# Patient Record
Sex: Male | Born: 1990 | Race: Black or African American | Hispanic: No | Marital: Single | Smoking: Never smoker
Health system: Southern US, Community
[De-identification: ages and names within clinical notes are randomized; demographics above are authoritative.]

---
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
## 2014-04-01 ENCOUNTER — Emergency Department (INDEPENDENT_AMBULATORY_CARE_PROVIDER_SITE_OTHER)
Admission: EM | Admit: 2014-04-01 | Discharge: 2014-04-01 | Disposition: A | Discharge status: Home / Self Care | Payer: 59 | Source: Home / Self Care | Attending: Family Medicine | Admitting: Family Medicine

## 2014-04-01 ENCOUNTER — Other Ambulatory Visit (HOSPITAL_COMMUNITY)
Admission: RE | Admit: 2014-04-01 | Discharge: 2014-04-01 | Disposition: A | Discharge status: Home / Self Care | Payer: 59 | Source: Ambulatory Visit | Attending: Family Medicine | Admitting: Family Medicine

## 2014-04-01 ENCOUNTER — Encounter (HOSPITAL_COMMUNITY): Payer: Self-pay | Admitting: Emergency Medicine

## 2014-04-01 DIAGNOSIS — N485 Ulcer of penis: Secondary | ICD-10-CM

## 2014-04-01 DIAGNOSIS — Z113 Encounter for screening for infections with a predominantly sexual mode of transmission: Secondary | ICD-10-CM | POA: Insufficient documentation

## 2014-04-01 DIAGNOSIS — N4889 Other specified disorders of penis: Secondary | ICD-10-CM

## 2014-04-01 LAB — HIV ANTIBODY (ROUTINE TESTING W REFLEX): HIV 1&2 Ab, 4th Generation: NONREACTIVE

## 2014-04-01 LAB — RPR

## 2014-04-01 MED ORDER — CEFTRIAXONE SODIUM 250 MG IJ SOLR
INTRAMUSCULAR | Status: AC
  Filled 2014-04-01: qty 250

## 2014-04-01 MED ORDER — AZITHROMYCIN 250 MG PO TABS
1000.0000 mg | ORAL_TABLET | Freq: Once | ORAL | Status: AC
  Administered 2014-04-01: 1000 mg via ORAL

## 2014-04-01 MED ORDER — PENICILLIN G BENZATHINE 1200000 UNIT/2ML IM SUSP
2.4000 10*6.[IU] | Freq: Once | INTRAMUSCULAR | Status: AC
  Administered 2014-04-01: 2.4 10*6.[IU] via INTRAMUSCULAR

## 2014-04-01 MED ORDER — AZITHROMYCIN 250 MG PO TABS
ORAL_TABLET | ORAL | Status: AC
  Filled 2014-04-01: qty 4

## 2014-04-01 MED ORDER — PENICILLIN G BENZATHINE 1200000 UNIT/2ML IM SUSP
INTRAMUSCULAR | Status: AC
  Filled 2014-04-01: qty 4

## 2014-04-01 MED ORDER — CEFTRIAXONE SODIUM 250 MG IJ SOLR
250.0000 mg | Freq: Once | INTRAMUSCULAR | Status: AC
  Administered 2014-04-01: 250 mg via INTRAMUSCULAR

## 2014-04-01 NOTE — Discharge Instructions (Signed)
We have tested you for multiple sexually transmitted infections as this lesion on your penis is suspicious for herpes or syphilis. You will be notified by our nurse here at the clinic should your lab results indicate the need for additional treatment. Please keep area clean and dry and do not engage in sexual activity until test results are available. You have also been treated today for gonorrhea, syphilis and chlamydia. If condition does not begin to improve over the next few days, please return for re-evaluation.

## 2014-04-01 NOTE — ED Provider Notes (Signed)
CSN: [id number]     Arrival date & time 04/01/14  1145 History   First MD Initiated Contact with Patient 04/01/14 1300     Chief Complaint  Patient presents with  . Abrasion   (Consider location/radiation/quality/duration/timing/severity/associated sxs/prior Treatment) HPI Comments: Patient presents with 12-14 day history of a tender genital lesion. States he thought the lesions may have been the result of running into another player during a basketball game two weeks ago. Patient declines to discuss sexual history. 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 PCP: none Denies fever/chill, dysuria, hematuria, or penile discharge Has had no previous episodes of same.   The history is provided by the patient.    History reviewed. No pertinent past medical history. History reviewed. No pertinent past surgical history. History reviewed. No pertinent family history. History  Substance Use Topics  . Smoking status: Never Smoker   . Smokeless tobacco: Not on file  . Alcohol Use: No    Review of Systems  All other systems reviewed and are negative.   Allergies  Review of patient's allergies indicates no known allergies.  Home Medications   Prior to Admission medications   Medication Sig Start Date End Date Taking? Authorizing Provider  neomycin-bacitracin-polymyxin (NEOSPORIN) ointment Apply 1 application topically every 12 (twelve) hours. apply to eye   Yes Historical Provider, MD   BP 131/70  Pulse 77  Temp(Src) 99.5 F (37.5 C) (Oral)  Resp 14  SpO2 100% Physical Exam  Nursing note and vitals reviewed. Constitutional: He is oriented to person, place, and time. He appears well-developed and well-nourished. No distress.  HENT:  Head: Normocephalic and atraumatic.  Mouth/Throat: Oropharynx is clear and moist.  No mucous membrane lesions  Eyes: Conjunctivae are normal.  Cardiovascular: Normal rate, regular rhythm and normal heart sounds.   Pulmonary/Chest: Effort normal  and breath sounds normal.  Genitourinary: Testes normal.    Right testis shows no mass, no swelling and no tenderness. Left testis shows no mass, no swelling and no tenderness. Circumcised.  Musculoskeletal: Normal range of motion.  Lymphadenopathy:       Right: No inguinal adenopathy present.       Left: No inguinal adenopathy present.  Neurological: He is alert and oriented to person, place, and time.  Skin: Skin is warm and dry.  See GU exam  Psychiatric: He has a normal mood and affect. His behavior is normal.    ED Course  Procedures (including critical care time) Labs Review Labs Reviewed  HERPES SIMPLEX VIRUS CULTURE  RPR  HIV ANTIBODY (ROUTINE TESTING)  HSV 1 ANTIBODY, IGG  HSV 2 ANTIBODY, IGG  URINE CYTOLOGY ANCILLARY ONLY    Imaging Review No results found.   MDM   1. Penile ulcer    Genital ulcer: testing sent for syphilis, gonorrhea, chlamydia, HSV1, HSV2, and HIV. Treated empirically for gonorrhea, chlamydia and primary syphilis. Will contact patient if labs indicate need for additional treatment.    Jess BartersJennifer Lee Thunder MountainPresson, [place] 04/01/14 [phone]

## 2014-04-01 NOTE — ED Notes (Signed)
Pt  Reports  He  Sustained  An inj  To   His  Penis  About 1  Week  Ago    Which  He  Reports  He  Was  Struck  By  Another  Players  Knee     He  denys  Any  Difficulty  Urinating  He   Reports  It  Was  Swollen  More   Early  And  It is  No  Better  He  Reports  He  Has  Been  appliing  Topical neosporin to  The  area

## 2014-04-02 LAB — URINE CYTOLOGY ANCILLARY ONLY
Chlamydia: POSITIVE — AB
NEISSERIA GONORRHEA: NEGATIVE
TRICH (WINDOWPATH): POSITIVE — AB

## 2014-04-02 LAB — HSV 2 ANTIBODY, IGG: HSV 2 Glycoprotein G Ab, IgG: <0.1 IV

## 2014-04-02 LAB — HSV 1 ANTIBODY, IGG: HSV 1 Glycoprotein G Ab, IgG: <0.1 IV

## 2014-04-02 NOTE — ED Provider Notes (Signed)
Medical screening examination/treatment/procedure(s) were performed by a resident physician or non-physician practitioner and as the supervising physician I was immediately available for consultation/collaboration.  Maram Bently, MD    Artha Stavros S Brystol Wasilewski, MD 04/02/14 0750 

## 2014-04-05 LAB — HERPES SIMPLEX VIRUS CULTURE: Culture: DETECTED

## 2014-04-05 MED ORDER — METRONIDAZOLE 500 MG PO TABS: 500.0000 mg | ORAL_TABLET | Freq: Two times a day (BID) | ORAL | Status: AC

## 2014-04-06 ENCOUNTER — Telehealth (HOSPITAL_COMMUNITY): Payer: Self-pay | Admitting: *Deleted

## 2014-04-06 NOTE — ED Notes (Addendum)
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  Pt. adequately treated for Chlamydia with Zithromax and also got Rocephin and Penicillin.  Narda BondsLee Presson PA did Rx. of Flagyl for Trich.  Discussed with Dr. Artis FlockKindl and he said it is to late to treat the Herpes. Tell pt. treatment has to start within 48 hrs for medication to be effective, for any future lesions. I called pt. and left a message to call.  Call 1. [facility] Xavier Carson 04/06/2014

## 2014-04-07 NOTE — ED Notes (Signed)
Pt. called back.  Pt. verified x 2 and given results.  Pt. told he needs Flagyl for Trich.  Pt. wants Rx. [facility] to [facility] [place].  Pt. told he was adequately treated for Chlamydia with Zithromax.  Pt. instructed to notify his partner to be treated for both infections, no sex for 1 week or until partner has finished treatment and to practice safe sex. Pt. told he [facility], by appointment.  Pt.told that it was to late to treat the Herpes Type 2 because it treatment has to be started within 48 hrs of the outbreak.  Pt. instructed to notify his partner, that he can pass the virus even when he doesn't have an outbreak, so always practice safe sex, and to get treated for each outbreak with Acyclovir or Valtrex. Pt.told to get a doctor who can give him a 1 yr Rx. to fill with each outbreak or suppressive treatment if needed.  Pt. voiced understanding. Rx. called to pharmacist @ [phone].

## 2016-03-15 ENCOUNTER — Ambulatory Visit (INDEPENDENT_AMBULATORY_CARE_PROVIDER_SITE_OTHER): Payer: Self-pay

## 2016-03-15 ENCOUNTER — Encounter (HOSPITAL_COMMUNITY): Payer: Self-pay | Admitting: Emergency Medicine

## 2016-03-15 ENCOUNTER — Ambulatory Visit (HOSPITAL_COMMUNITY)
Admission: EM | Admit: 2016-03-15 | Discharge: 2016-03-15 | Disposition: A | Discharge status: Home / Self Care | Payer: Self-pay | Attending: Family Medicine | Admitting: Family Medicine

## 2016-03-15 DIAGNOSIS — S63602A Unspecified sprain of left thumb, initial encounter: Secondary | ICD-10-CM

## 2016-03-15 MED ORDER — DICLOFENAC POTASSIUM 50 MG PO TABS: 50.0000 mg | ORAL_TABLET | Freq: Three times a day (TID) | ORAL | Status: AC

## 2016-03-15 NOTE — Discharge Instructions (Signed)
Wear splint and use medicine as prescribed, see orthopedist if further problems.

## 2016-03-15 NOTE — ED Notes (Signed)
Left thumb injury.  Reports left thumb injured while playing football 2 weeks ago.  Patient thought this was a jammed finger.  Patient reports limited movement of thumb, making it difficult to grip.

## 2016-03-15 NOTE — ED Provider Notes (Signed)
CSN: [id number]     Arrival date & time 03/15/16  1843 History   First MD Initiated Contact with Patient 03/15/16 1929     Chief Complaint  Patient presents with  . Hand Injury   (Consider location/radiation/quality/duration/timing/severity/associated sxs/prior Treatment) Patient is a 25 y.o. male presenting with hand injury. The history is provided by the patient.  Hand Injury Location:  Finger Time since incident:  2 weeks Injury: yes   Mechanism of injury comment:  Injured playing football  Finger location:  L thumb Pain details:    Quality:  Sharp   Radiates to:  Does not radiate   Severity:  Moderate   Onset quality:  Sudden Chronicity:  New Dislocation: no   Prior injury to area:  No Worsened by:  Nothing tried Ineffective treatments:  None tried Associated symptoms: decreased range of motion and swelling     History reviewed. No pertinent past medical history. History reviewed. No pertinent past surgical history. No family history on file. Social History  Substance Use Topics  . Smoking status: Never Smoker   . Smokeless tobacco: None  . Alcohol Use: No    Review of Systems  Constitutional: Negative.   Musculoskeletal: Positive for joint swelling.  Skin: Negative.   All other systems reviewed and are negative.   Allergies  Review of patient's allergies indicates no known allergies.  Home Medications   Prior to Admission medications   Medication Sig Start Date End Date Taking? Authorizing Provider  diclofenac (CATAFLAM) 50 MG tablet Take 1 tablet (50 mg total) by mouth 3 (three) times daily. 03/15/16   Linna HoffJames D Kindl, MD  metroNIDAZOLE (FLAGYL) 500 MG tablet Take 1 tablet (500 mg total) by mouth 2 (two) times daily. 04/05/14   Mathis FareJennifer Lee H Presson, PA  neomycin-bacitracin-polymyxin (NEOSPORIN) ointment Apply 1 application topically every 12 (twelve) hours. apply to eye    Historical Provider, MD   Meds Ordered and Administered this Visit  Medications - No  data to display  BP 126/79 mmHg  Pulse 50  Temp(Src) 98 F (36.7 C) (Oral)  Resp 16  SpO2 97% No data found.   Physical Exam  Constitutional: He is oriented to person, place, and time. He appears well-developed and well-nourished.  Musculoskeletal: He exhibits tenderness.       Hands: Neurological: He is alert and oriented to person, place, and time.  Skin: Skin is warm and dry.  Nursing note and vitals reviewed.   ED Course  Procedures (including critical care time)  Labs Review Labs Reviewed - No data to display  Imaging Review Dg Finger Thumb Left  03/15/2016  CLINICAL DATA:  Status post fall with a left thumb injury two weeks ago. Continued pain. Initial encounter. EXAM: LEFT THUMB 2+V COMPARISON:  None. FINDINGS: There is no evidence of fracture or dislocation. There is no evidence of arthropathy or other focal bone abnormality. Soft tissues are unremarkable IMPRESSION: Negative exam. Electronically Signed   By: Drusilla Kannerhomas  Dalessio M.D.   On: 03/15/2016 19:59     Visual Acuity Review  Right Eye Distance:   Left Eye Distance:   Bilateral Distance:    Right Eye Near:   Left Eye Near:    Bilateral Near:         MDM   1. Thumb sprain, left, initial encounter        Linna HoffJames D Kindl, MD 03/15/16 2016

## 2016-10-11 IMAGING — DX DG FINGER THUMB 2+V*L*
3 series · 3 of 3 positions shown · non-contrast
Comparison: None.

CLINICAL DATA: Status post fall with a left thumb injury two weeks
ago. Continued pain. Initial encounter.

EXAM:
LEFT THUMB 2+V

[finger ap]
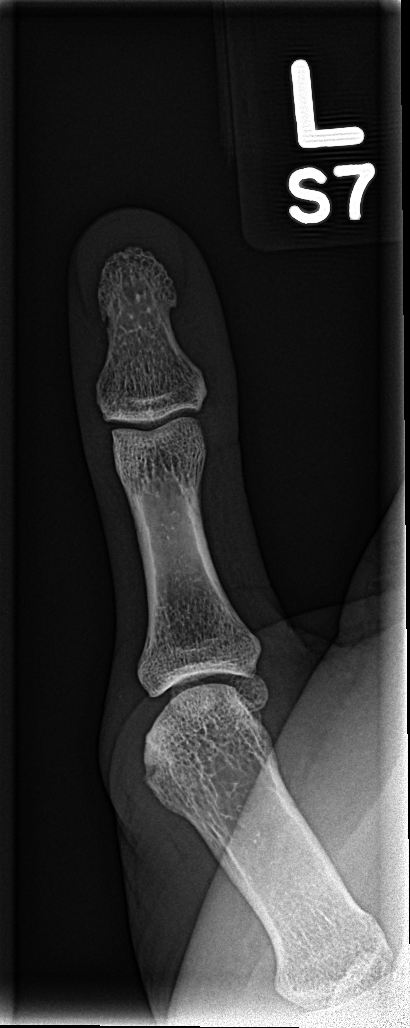

[finger obl]
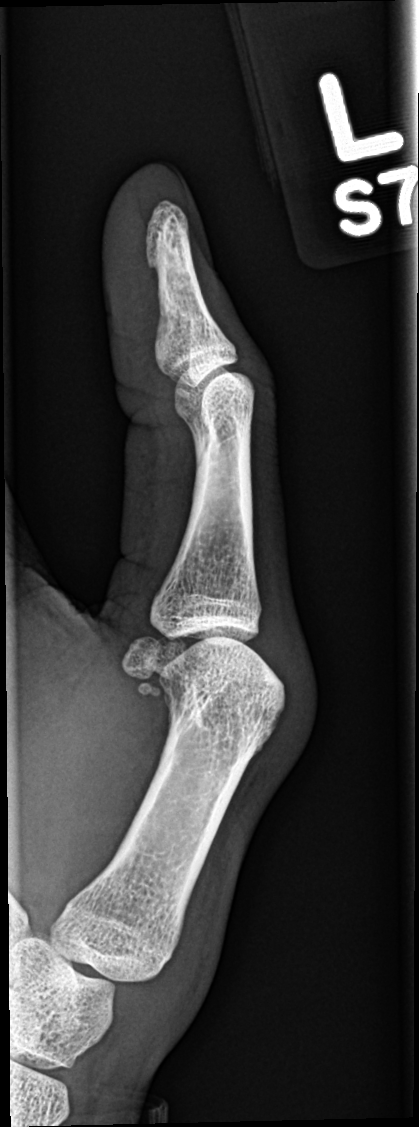

[finger lat]
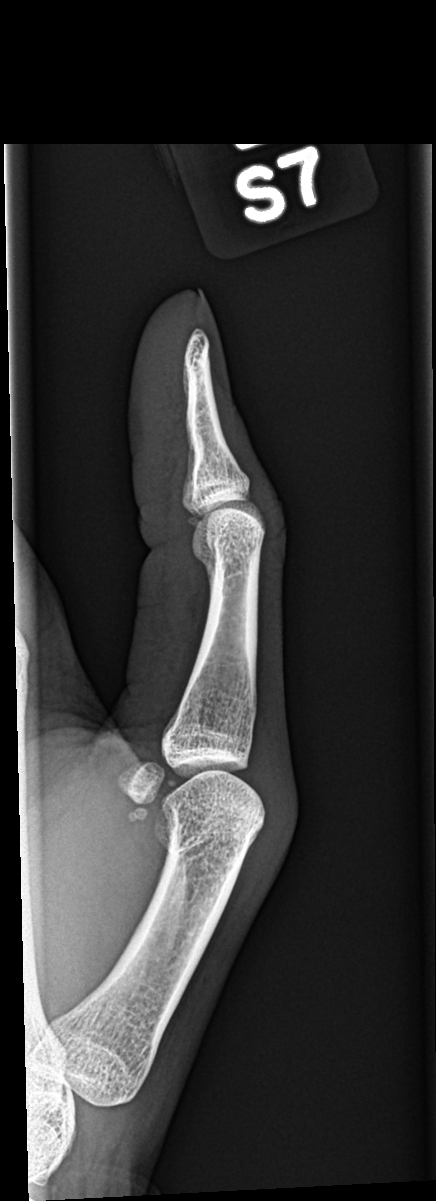

[3 of 3 positions shown; findings below may reference images not displayed]

FINDINGS: There is no evidence of fracture or dislocation. There is no
evidence of arthropathy or other focal bone abnormality. Soft
tissues are unremarkable
IMPRESSION: Negative exam.
